# Patient Record
Sex: Female | Born: 1935 | Race: White | Hispanic: No
Health system: Southern US, Community
[De-identification: ages and names within clinical notes are randomized; demographics above are authoritative.]

## PROBLEM LIST (undated history)

## (undated) DIAGNOSIS — N390 Urinary tract infection, site not specified: Secondary | ICD-10-CM

## (undated) DIAGNOSIS — E079 Disorder of thyroid, unspecified: Secondary | ICD-10-CM

## (undated) DIAGNOSIS — I4891 Unspecified atrial fibrillation: Secondary | ICD-10-CM

## (undated) DIAGNOSIS — F0390 Unspecified dementia without behavioral disturbance: Secondary | ICD-10-CM

## (undated) DIAGNOSIS — N289 Disorder of kidney and ureter, unspecified: Secondary | ICD-10-CM

## (undated) DIAGNOSIS — R531 Weakness: Secondary | ICD-10-CM

## (undated) DIAGNOSIS — I2510 Atherosclerotic heart disease of native coronary artery without angina pectoris: Secondary | ICD-10-CM

---

## 2014-02-27 ENCOUNTER — Encounter (HOSPITAL_BASED_OUTPATIENT_CLINIC_OR_DEPARTMENT_OTHER): Payer: Self-pay | Admitting: Emergency Medicine

## 2014-02-27 ENCOUNTER — Emergency Department (HOSPITAL_BASED_OUTPATIENT_CLINIC_OR_DEPARTMENT_OTHER): Payer: Medicare Other

## 2014-02-27 ENCOUNTER — Emergency Department (HOSPITAL_BASED_OUTPATIENT_CLINIC_OR_DEPARTMENT_OTHER)
Admission: EM | Admit: 2014-02-27 | Discharge: 2014-02-27 | Disposition: A | Discharge status: Home / Self Care | Payer: Medicare Other | Attending: Emergency Medicine | Admitting: Emergency Medicine

## 2014-02-27 DIAGNOSIS — Z043 Encounter for examination and observation following other accident: Secondary | ICD-10-CM | POA: Diagnosis not present

## 2014-02-27 DIAGNOSIS — W19XXXA Unspecified fall, initial encounter: Secondary | ICD-10-CM | POA: Insufficient documentation

## 2014-02-27 DIAGNOSIS — Y998 Other external cause status: Secondary | ICD-10-CM | POA: Insufficient documentation

## 2014-02-27 DIAGNOSIS — Y939 Activity, unspecified: Secondary | ICD-10-CM | POA: Diagnosis not present

## 2014-02-27 DIAGNOSIS — Y92122 Bedroom in nursing home as the place of occurrence of the external cause: Secondary | ICD-10-CM | POA: Diagnosis not present

## 2014-02-27 DIAGNOSIS — F0280 Dementia in other diseases classified elsewhere without behavioral disturbance: Secondary | ICD-10-CM | POA: Insufficient documentation

## 2014-02-27 DIAGNOSIS — Y92129 Unspecified place in nursing home as the place of occurrence of the external cause: Secondary | ICD-10-CM

## 2014-02-27 DIAGNOSIS — G309 Alzheimer's disease, unspecified: Secondary | ICD-10-CM | POA: Insufficient documentation

## 2014-02-27 HISTORY — DX: Disorder of thyroid, unspecified: E07.9

## 2014-02-27 HISTORY — DX: Unspecified dementia, unspecified severity, without behavioral disturbance, psychotic disturbance, mood disturbance, and anxiety: F03.90

## 2014-02-27 HISTORY — DX: Unspecified atrial fibrillation: I48.91

## 2014-02-27 HISTORY — DX: Weakness: R53.1

## 2014-02-27 HISTORY — DX: Atherosclerotic heart disease of native coronary artery without angina pectoris: I25.10

## 2014-02-27 HISTORY — DX: Disorder of kidney and ureter, unspecified: N28.9

## 2014-02-27 HISTORY — DX: Urinary tract infection, site not specified: N39.0

## 2014-02-27 NOTE — ED Notes (Signed)
Patient transported to CT 

## 2014-02-27 NOTE — ED Provider Notes (Signed)
CSN: [id number]     Arrival date & time 02/27/14  0149 History   First MD Initiated Contact with Patient 02/27/14 [phone]     Chief Complaint  Patient presents with  . Fall     (Consider location/radiation/quality/duration/timing/severity/associated sxs/prior Treatment) HPI 81 year old female presents to emergency department via EMS from her nursing facility after a fall.  Patient was found on a wooden floor next to her bed.  It was an unwitnessed fall.  No known LOC.  Patient initially complained of some pain to the back of her head, but denies this now.  She has history of Alzheimer's and is a poor historian. No past medical history on file. No past surgical history on file. No family history on file. History  Substance Use Topics  . Smoking status: Not on file  . Smokeless tobacco: Not on file  . Alcohol Use: Not on file   OB History    No data available     Review of Systems  Unable to perform ROS: Dementia      Allergies  Review of patient's allergies indicates not on file.  Home Medications   Prior to Admission medications   Not on File   BP 155/59 mmHg  Pulse 61  Temp(Src) 97.6 F (36.4 C) (Oral)  Resp 20  SpO2 99% Physical Exam  Constitutional: She appears well-developed and well-nourished.  HENT:  Head: Normocephalic and atraumatic.  Right Ear: External ear normal.  Left Ear: External ear normal.  Nose: Nose normal.  Mouth/Throat: Oropharynx is clear and moist.  Eyes: Conjunctivae and EOM are normal. Pupils are equal, round, and reactive to light.  Neck: Normal range of motion. Neck supple. No JVD present. No tracheal deviation present. No thyromegaly present.  Cardiovascular: Normal rate, regular rhythm, normal heart sounds and intact distal pulses.  Exam reveals no gallop and no friction rub.   No murmur heard. Pulmonary/Chest: Effort normal and breath sounds normal. No stridor. No respiratory distress. She has no wheezes. She has no rales. She exhibits  no tenderness.  Abdominal: Soft. Bowel sounds are normal. She exhibits no distension and no mass. There is no tenderness. There is no rebound and no guarding.  Musculoskeletal: Normal range of motion. She exhibits no edema or tenderness.  All 4 extremities examined, range of motion is normal.  No pain to palpation.  No step-off no crepitus, no deformities  Lymphadenopathy:    She has no cervical adenopathy.  Neurological: She is alert. She displays normal reflexes. No cranial nerve deficit. She exhibits normal muscle tone. Coordination normal.  Skin: Skin is warm and dry. No rash noted. No erythema. No pallor.  Psychiatric: She has a normal mood and affect. Her behavior is normal. Judgment and thought content normal.  Nursing note and vitals reviewed.   ED Course  Procedures (including critical care time) Labs Review Labs Reviewed - No data to display  Imaging Review No results found.   EKG Interpretation None     No results found for this or any previous visit. Ct Head Wo Contrast  02/27/2014   CLINICAL DATA:  Patient was found on the floor of the side of the head. Head pain. No loss of consciousness. History of a dementia. Initial encounter  EXAM: CT HEAD WITHOUT CONTRAST  CT CERVICAL SPINE WITHOUT CONTRAST  TECHNIQUE: Multidetector CT imaging of the head and cervical spine was performed following the standard protocol without intravenous contrast. Multiplanar CT image reconstructions of the cervical spine were also generated.  COMPARISON:  10/15/2013  FINDINGS: CT HEAD FINDINGS  Diffuse cerebral atrophy. Low-attenuation changes in the deep white matter consistent with small vessel ischemia. Ventricular dilatation consistent with central atrophy. Focal area of encephalomalacia in the right posterior parietal region consistent with old infarct, unchanged since prior study. No mass effect or midline shift. No abnormal extra-axial fluid collections. Gray-white matter junctions are distinct.  Basal cisterns are not effaced. No evidence of acute intracranial hemorrhage. No depressed skull fractures. Mild mucosal thickening in the paranasal sinuses. Mastoid air cells are not opacified. Vascular calcifications.  CT CERVICAL SPINE FINDINGS  Diffuse bone demineralization. Mild anterior subluxation of C4 on C5, unchanged since prior study. Normal alignment of facet joints. Degenerative changes throughout the cervical spine and facet joints. No vertebral compression deformities. No prevertebral soft tissue swelling. C1-2 articulation appears intact. No focal bone destruction or bone lesion identified. Bone cortex and trabecular architecture appear intact. Soft tissues are unremarkable. Vascular calcifications in the cervical carotid arteries. Surgical absence of the right thyroid with nodular enlargement of the left thyroid.  IMPRESSION: No acute intracranial abnormalities. Unchanged mild anterior subluxation of C4 on C5. Diffuse degenerative change throughout the cervical spine. No acute displaced fractures identified. No change since prior studies.   Electronically Signed   By: Burman NievesWilliam  Stevens M.D.   On: 02/27/2014 02:28   Ct Cervical Spine Wo Contrast  02/27/2014   CLINICAL DATA:  Patient was found on the floor of the side of the head. Head pain. No loss of consciousness. History of a dementia. Initial encounter  EXAM: CT HEAD WITHOUT CONTRAST  CT CERVICAL SPINE WITHOUT CONTRAST  TECHNIQUE: Multidetector CT imaging of the head and cervical spine was performed following the standard protocol without intravenous contrast. Multiplanar CT image reconstructions of the cervical spine were also generated.  COMPARISON:  10/15/2013  FINDINGS: CT HEAD FINDINGS  Diffuse cerebral atrophy. Low-attenuation changes in the deep white matter consistent with small vessel ischemia. Ventricular dilatation consistent with central atrophy. Focal area of encephalomalacia in the right posterior parietal region consistent with  old infarct, unchanged since prior study. No mass effect or midline shift. No abnormal extra-axial fluid collections. Gray-white matter junctions are distinct. Basal cisterns are not effaced. No evidence of acute intracranial hemorrhage. No depressed skull fractures. Mild mucosal thickening in the paranasal sinuses. Mastoid air cells are not opacified. Vascular calcifications.  CT CERVICAL SPINE FINDINGS  Diffuse bone demineralization. Mild anterior subluxation of C4 on C5, unchanged since prior study. Normal alignment of facet joints. Degenerative changes throughout the cervical spine and facet joints. No vertebral compression deformities. No prevertebral soft tissue swelling. C1-2 articulation appears intact. No focal bone destruction or bone lesion identified. Bone cortex and trabecular architecture appear intact. Soft tissues are unremarkable. Vascular calcifications in the cervical carotid arteries. Surgical absence of the right thyroid with nodular enlargement of the left thyroid.  IMPRESSION: No acute intracranial abnormalities. Unchanged mild anterior subluxation of C4 on C5. Diffuse degenerative change throughout the cervical spine. No acute displaced fractures identified. No change since prior studies.   Electronically Signed   By: Burman NievesWilliam  Stevens M.D.   On: 02/27/2014 02:28     MDM   Final diagnoses:  Fall at nursing home, initial encounter    81 year old female found on the floor of her nursing facility unwitnessed fall.  Plan for head and C-spine CT scan.  If negative, feel the patient is stable for discharge back to her facility.    Olivia Mackielga M Jedaiah Rathbun, MD 02/27/14 [phone]

## 2014-02-27 NOTE — ED Notes (Signed)
Poss fell at nursing home  Was found on wooden floor  No loss of consciousness  Pt denies falling   Complained of some pain back of head earlier none now

## 2014-02-27 NOTE — Discharge Instructions (Signed)
Your evaluation in the ER today after your fall did not show any broken bones or serious injury.  Please be careful in the future when you are getting out of bed or a chair as you may fall again.  Follow up with your doctor for recheck in 2-3 days.  Return to the ER for worsening condition or new concerning symptoms.    FALL PREVENTION (EDU)  FALL PREVENTION (EDU): You have requested information on Fall Prevention.  According to a 2003 study from the Journal of the [employer], more than 1.8 million adults, aged 79 and older, were treated in emergency departments for fall-related injuries. More than 421,000 were hospitalized. The most common injuries from a fall are head injuries that in turn cause a brain injury, and fractures (broken bones). Of all types of broken bones that happen from falls, hip fractures are the most serious and lead to the greatest number of health problems and deaths.  To make your living area safer, older adults should consider the following:      Improve lighting throughout the home. Use night-lights to help you see at night.     Have handrails installed on both sides of stairways.     Have grab bars placed next to the toilet and in the shower. Also consider an elevated toilet seat and a shower chair.     Use non-slip bath mats in the tub or shower.     Remove "throw rugs" to prevent tripping.     Avoid the use of long robes to prevent tripping.     Wear well-fitted shoes or slippers. Wearing loose footwear can cause you to shuffle, and make you more likely to trip and fall. Inexpensive anti-slip socks can also be purchased.     Be sure to keep all electrical cords and small objects out of the pathway.     If a cane, walker or any other assistive device is used, be sure to have them inspected regularly. The devices must be used correctly to prevent injuries.     Remember to move about at a pace that is comfortable for your ability. For  example, do not rush to answer the doorbell or the phone. Take your time. In recent studies, a number of risk factors have been identified that make older adults more likely to have falls. It has also been shown that when these risk factors are modified, it will help to prevent falls.      Exercise: Regular physical activity or exercise increases body strength and improves balance.     Medication Review: Follow up with your doctor and pharmacist as needed to review your medications and any recent changes that may have been made. They can tell you if there are drug interactions and side-effects. If you are taking any sedatives or sleeping pills, it might be possible to have the dosage decreased, or have the number of medications reduced. These kinds of medications can cause drowsiness and dizziness, thus posing a risk of falling.     Vision Checks: Follow up with an eye doctor at least once a year to have your vision checked.  If you develop symptoms of Shortness of Breath, Chest Pain, Swelling of lips, mouth or tongue or if your condition becomes worse with any new symptoms, see your doctor or return to the Emergency Department for immediate care. Emergency services are not intended to be a substitute for comprehensive medical attention.  Please contact your doctor for follow up  if not improving as expected.   Call your doctor in 5-7 days or as directed if there is no improvement.   Community Resources: *IF YOU ARE IN IMMEDIATE DANGER CALL 911!  Abuse/Neglect:  Family Services Crisis Hotline [facility]([place]): [phone](336) [phone] Center Against Violence [facility]([place]): 4122598713(336) [phone]  After hours, holidays and weekends: [phone] National Domestic Violence Hotline: (434)348-2491713-299-2070  Mental Health: [facility]: [address]: [phone]  Health Clinics:  [facility]): [phone] Monday - Friday 8 AM - 9 PM, Saturday and Sunday 10 AM - 9  PM  [facility] [place]: [phone] Monday - Friday 8 AM - 5 PM  [facility]  [address]: [phone] Monday- Friday 8:30 AM - 5:30 PM, Sat 9 AM - 1 PM  24 [facility] [place] [facility] [address]: [phone] [facility] [address]: [phone] [facility] on [address]: [phone]  24 [facility]: [address] [phone]  Cultures: If culture results are positive, we will notify you if a change in treatment is necessary.  LABORATORY TESTS:         If you had any labs drawn in the ED that have not resulted by the time you are discharged home, we will review these lab results and the treatment given to you.  If there is any further treatment or notification needed, we will contact you by phone, or letter.  "PLEASE ENSURE THAT YOU HAVE GIVEN US YOUR CURRENT WORKING PHONE NUMBER AND YOUR CURRENT ADDRESS, so that we can contact you if needed."  RADIOLOGY TESTS:  If the referred physician wants todays x-rays, please call the hospitals Radiology Department the day before your doctors appointment. [place]     [phone] [place]   [phone] [place]     [phone]  Our doctors and staff appreciate your choosing us for your emergency medical care needs. We are here to serve you

## 2014-02-27 NOTE — ED Notes (Signed)
Pt returned home by ptar

## 2014-02-27 NOTE — ED Notes (Signed)
Per ems pt found on floor beside bed,  Ems stated she c/o some head pain on their arrival,  At present pt denies pain, no abrasion or hematoma noted denies loc Pt alert

## 2014-03-21 ENCOUNTER — Emergency Department (HOSPITAL_BASED_OUTPATIENT_CLINIC_OR_DEPARTMENT_OTHER)
Admission: EM | Admit: 2014-03-21 | Discharge: 2014-03-21 | Disposition: A | Discharge status: Home / Self Care | Payer: Medicare Other | Attending: Emergency Medicine | Admitting: Emergency Medicine

## 2014-03-21 ENCOUNTER — Emergency Department (HOSPITAL_BASED_OUTPATIENT_CLINIC_OR_DEPARTMENT_OTHER): Payer: Medicare Other

## 2014-03-21 ENCOUNTER — Encounter (HOSPITAL_BASED_OUTPATIENT_CLINIC_OR_DEPARTMENT_OTHER): Payer: Self-pay

## 2014-03-21 DIAGNOSIS — I2510 Atherosclerotic heart disease of native coronary artery without angina pectoris: Secondary | ICD-10-CM | POA: Insufficient documentation

## 2014-03-21 DIAGNOSIS — Y9289 Other specified places as the place of occurrence of the external cause: Secondary | ICD-10-CM | POA: Insufficient documentation

## 2014-03-21 DIAGNOSIS — Z87448 Personal history of other diseases of urinary system: Secondary | ICD-10-CM | POA: Diagnosis not present

## 2014-03-21 DIAGNOSIS — Z87440 Personal history of urinary (tract) infections: Secondary | ICD-10-CM | POA: Insufficient documentation

## 2014-03-21 DIAGNOSIS — Y998 Other external cause status: Secondary | ICD-10-CM | POA: Insufficient documentation

## 2014-03-21 DIAGNOSIS — W19XXXA Unspecified fall, initial encounter: Secondary | ICD-10-CM | POA: Diagnosis not present

## 2014-03-21 DIAGNOSIS — Z8639 Personal history of other endocrine, nutritional and metabolic disease: Secondary | ICD-10-CM | POA: Diagnosis not present

## 2014-03-21 DIAGNOSIS — Y9389 Activity, other specified: Secondary | ICD-10-CM | POA: Diagnosis not present

## 2014-03-21 DIAGNOSIS — Z79899 Other long term (current) drug therapy: Secondary | ICD-10-CM | POA: Insufficient documentation

## 2014-03-21 DIAGNOSIS — S0990XA Unspecified injury of head, initial encounter: Secondary | ICD-10-CM | POA: Insufficient documentation

## 2014-03-21 DIAGNOSIS — F0390 Unspecified dementia without behavioral disturbance: Secondary | ICD-10-CM | POA: Diagnosis not present

## 2014-03-21 LAB — URINALYSIS, ROUTINE W REFLEX MICROSCOPIC
BILIRUBIN URINE: NEGATIVE
Glucose, UA: NEGATIVE mg/dL
Hgb urine dipstick: NEGATIVE
KETONES UR: NEGATIVE mg/dL
Leukocytes, UA: NEGATIVE
NITRITE: NEGATIVE
PH: 6 (ref 5.0–8.0)
PROTEIN: 30 mg/dL — AB
SPECIFIC GRAVITY, URINE: 1.015 (ref 1.005–1.030)
Urobilinogen, UA: 0.2 mg/dL (ref 0.0–1.0)

## 2014-03-21 LAB — URINE MICROSCOPIC-ADD ON

## 2014-03-21 MED ORDER — ACETAMINOPHEN 325 MG PO TABS
650.0000 mg | ORAL_TABLET | Freq: Once | ORAL | Status: AC
  Administered 2014-03-21: 650 mg via ORAL
  Filled 2014-03-21: qty 2

## 2014-03-21 NOTE — ED Notes (Signed)
Called [facility] for transport to [facility]

## 2014-03-21 NOTE — ED Provider Notes (Signed)
TIME SEEN: 11:20 AM  CHIEF COMPLAINT: Fall  HPI: Pt is a 81 y.o. female with history of dementia, coronary artery disease, atrial fibrillation, hypothyroidism who had unwitnessed fall at her nursing facility. She is complaining of pain to the back of her head. It is unclear if she lost consciousness. She has been here in the past for similar symptoms. Does not appear to be on anticoagulation.  ROS: Level V caveat for dementia  PAST MEDICAL HISTORY/PAST SURGICAL HISTORY:  Past Medical History  Diagnosis Date  . Renal disorder   . UTI (urinary tract infection)   . Dementia   . Coronary artery disease   . Weakness   . Atrial fibrillation   . Thyroid disease     MEDICATIONS:  Prior to Admission medications   Medication Sig Start Date End Date Taking? Authorizing Provider  atenolol (TENORMIN) 50 MG tablet Take 50 mg by mouth daily.    Historical Provider, MD  cyanocobalamin 500 MCG tablet Take 500 mcg by mouth daily.    Historical Provider, MD  diltiazem (DILACOR XR) 240 MG 24 hr capsule Take 240 mg by mouth daily.    Historical Provider, MD  divalproex (DEPAKOTE SPRINKLE) 125 MG capsule Take 125 mg by mouth 3 (three) times daily.    Historical Provider, MD  LORazepam (ATIVAN) 0.5 MG tablet Take 0.5 mg by mouth 2 (two) times daily. prn    Historical Provider, MD  memantine (NAMENDA) 10 MG tablet Take 14 mg by mouth 2 (two) times daily.    Historical Provider, MD  ondansetron (ZOFRAN) 4 MG tablet Take 4 mg by mouth every 8 (eight) hours as needed for nausea or vomiting.    Historical Provider, MD  polyvinyl alcohol (LIQUIFILM TEARS) 1.4 % ophthalmic solution Place 2 drops into both eyes as needed for dry eyes.    Historical Provider, MD  Vitamin D, Ergocalciferol, (DRISDOL) 50000 UNITS CAPS capsule Take 50,000 Units by mouth every 7 (seven) days.    Historical Provider, MD    ALLERGIES:  No Known Allergies  SOCIAL HISTORY:  History  Substance Use Topics  . Smoking status: Unknown If  Ever Smoked  . Smokeless tobacco: Not on file  . Alcohol Use: No    FAMILY HISTORY: No family history on file.  EXAM: BP 169/59 mmHg  Pulse 57  Temp(Src) 97.7 F (36.5 C) (Oral)  Resp 18  SpO2 98% CONSTITUTIONAL: Alert and oriented and responds appropriately to questions. Well-appearing; well-nourished; GCS 15 HEAD: Normocephalic; atraumatic EYES: Conjunctivae clear, PERRL, EOMI ENT: normal nose; no rhinorrhea; moist mucous membranes; pharynx without lesions noted; no dental injury; ; no septal hematoma NECK: Supple, no meningismus, no LAD; no midline spinal tenderness, step-off or deformity CARD: RRR; S1 and S2 appreciated; no murmurs, no clicks, no rubs, no gallops RESP: Normal chest excursion without splinting or tachypnea; breath sounds clear and equal bilaterally; no wheezes, no rhonchi, no rales; chest wall stable, nontender to palpation ABD/GI: Normal bowel sounds; non-distended; soft, non-tender, no rebound, no guarding PELVIS:  stable, nontender to palpation BACK:  The back appears normal and is non-tender to palpation, there is no CVA tenderness; no midline spinal tenderness, step-off or deformity EXT: Normal ROM in all joints; non-tender to palpation; no edema; normal capillary refill; no cyanosis    SKIN: Normal color for age and race; warm NEURO: Moves all extremities equally, reports normal sensation diffusely, cranial nerves II through XII intact PSYCH: The patient's mood and manner are appropriate. Grooming and personal hygiene are  appropriate.  MEDICAL DECISION MAKING: Patient here with unwitnessed fall. No obvious sign of injury on exam except for complaints of posterior headache. We'll give Tylenol. We'll obtain CT imaging of her head and cervical spine.  ED PROGRESS: Imaging is unremarkable for acute injury. She still neurologically baseline. We'll discharge back to nursing facility.     Layla MawKristen N Smt. L[place], DO 03/21/14 [phone]

## 2014-03-21 NOTE — ED Notes (Signed)
Tripped on a blanket and fell on the ground striking her head on the floor. No LOC. Pt has no complaints.

## 2014-03-21 NOTE — Discharge Instructions (Signed)
Head Injury  You have received a head injury. It does not appear serious at this time. Headaches and vomiting are common following head injury. It should be easy to awaken from sleeping. Sometimes it is necessary for you to stay in the emergency department for a while for observation. Sometimes admission to the hospital may be needed. After injuries such as yours, most problems occur within the first 24 hours, but side effects may occur up to 7-10 days after the injury. It is important for you to carefully monitor your condition and contact your health care provider or seek immediate medical care if there is a change in your condition.  WHAT ARE THE TYPES OF HEAD INJURIES?  Head injuries can be as minor as a bump. Some head injuries can be more severe. More severe head injuries include:   A jarring injury to the brain (concussion).   A bruise of the brain (contusion). This mean there is bleeding in the brain that can cause swelling.   A cracked skull (skull fracture).   Bleeding in the brain that collects, clots, and forms a bump (hematoma).  WHAT CAUSES A HEAD INJURY?  A serious head injury is most likely to happen to someone who is in a car wreck and is not wearing a seat belt. Other causes of major head injuries include bicycle or motorcycle accidents, sports injuries, and falls.  HOW ARE HEAD INJURIES DIAGNOSED?  A complete history of the event leading to the injury and your current symptoms will be helpful in diagnosing head injuries. Many times, pictures of the brain, such as CT or MRI are needed to see the extent of the injury. Often, an overnight hospital stay is necessary for observation.   WHEN SHOULD I SEEK IMMEDIATE MEDICAL CARE?   You should get help right away if:   You have confusion or drowsiness.   You feel sick to your stomach (nauseous) or have continued, forceful vomiting.   You have dizziness or unsteadiness that is getting worse.   You have severe, continued headaches not relieved by  medicine. Only take over-the-counter or prescription medicines for pain, fever, or discomfort as directed by your health care provider.   You do not have normal function of the arms or legs or are unable to walk.   You notice changes in the black spots in the center of the colored part of your eye (pupil).   You have a clear or bloody fluid coming from your nose or ears.   You have a loss of vision.  During the next 24 hours after the injury, you must stay with someone who can watch you for the warning signs. This person should contact local emergency services (911 in the [place] if you have seizures, you become unconscious, or you are unable to wake up.  HOW CAN I PREVENT A HEAD INJURY IN THE FUTURE?  The most important factor for preventing major head injuries is avoiding motor vehicle accidents. To minimize the potential for damage to your head, it is crucial to wear seat belts while riding in motor vehicles. Wearing helmets while bike riding and playing collision sports (like football) is also helpful. Also, avoiding dangerous activities around the house will further help reduce your risk of head injury.   WHEN CAN I RETURN TO NORMAL ACTIVITIES AND ATHLETICS?  You should be reevaluated by your health care provider before returning to these activities. If you have any of the following symptoms, you should not return to activities   or contact sports until 1 week after the symptoms have stopped:   Persistent headache.   Dizziness or vertigo.   Poor attention and concentration.   Confusion.   Memory problems.   Nausea or vomiting.   Fatigue or tire easily.   Irritability.   Intolerant of bright lights or loud noises.   Anxiety or depression.   Disturbed sleep.  MAKE SURE YOU:    Understand these instructions.   Will watch your condition.   Will get help right away if you are not doing well or get worse.  Document Released: 02/08/2005 Document Revised: 02/13/2013 Document Reviewed:  10/16/2012  [employer] Patient Information 2015 [employer]. This information is not intended to replace advice given to you by your health care provider. Make sure you discuss any questions you have with your health care provider.    Fall Prevention and Home Safety  Falls cause injuries and can affect all age groups. It is possible to use preventive measures to significantly decrease the likelihood of falls. There are many simple measures which can make your home safer and prevent falls.  OUTDOORS   Repair cracks and edges of walkways and driveways.   Remove high doorway thresholds.   Trim shrubbery on the main path into your home.   Have good outside lighting.   Clear walkways of tools, rocks, debris, and clutter.   Check that handrails are not broken and are securely fastened. Both sides of steps should have handrails.   Have leaves, snow, and ice cleared regularly.   Use sand or salt on walkways during winter months.   In the garage, clean up grease or oil spills.  BATHROOM   Install night lights.   Install grab bars by the toilet and in the tub and shower.   Use non-skid mats or decals in the tub or shower.   Place a plastic non-slip stool in the shower to sit on, if needed.   Keep floors dry and clean up all water on the floor immediately.   Remove soap buildup in the tub or shower on a regular basis.   Secure bath mats with non-slip, double-sided rug tape.   Remove throw rugs and tripping hazards from the floors.  BEDROOMS   Install night lights.   Make sure a bedside light is easy to reach.   Do not use oversized bedding.   Keep a telephone by your bedside.   Have a firm chair with side arms to use for getting dressed.   Remove throw rugs and tripping hazards from the floor.  KITCHEN   Keep handles on pots and pans turned toward the center of the stove. Use back burners when possible.   Clean up spills quickly and allow time for drying.   Avoid walking on wet floors.   Avoid hot  utensils and knives.   Position shelves so they are not too high or low.   Place commonly used objects within easy reach.   If necessary, use a sturdy step stool with a grab bar when reaching.   Keep electrical cables out of the way.   Do not use floor polish or wax that makes floors slippery. If you must use wax, use non-skid floor wax.   Remove throw rugs and tripping hazards from the floor.  STAIRWAYS   Never leave objects on stairs.   Place handrails on both sides of stairways and use them. Fix any loose handrails. Make sure handrails on both sides of the stairways   are as long as the stairs.   Check carpeting to make sure it is firmly attached along stairs. Make repairs to worn or loose carpet promptly.   Avoid placing throw rugs at the top or bottom of stairways, or properly secure the rug with carpet tape to prevent slippage. Get rid of throw rugs, if possible.   Have an electrician put in a light switch at the top and bottom of the stairs.  OTHER FALL PREVENTION TIPS   Wear low-heel or rubber-soled shoes that are supportive and fit well. Wear closed toe shoes.   When using a stepladder, make sure it is fully opened and both spreaders are firmly locked. Do not climb a closed stepladder.   Add color or contrast paint or tape to grab bars and handrails in your home. Place contrasting color strips on first and last steps.   Learn and use mobility aids as needed. Install an electrical emergency response system.   Turn on lights to avoid dark areas. Replace light bulbs that burn out immediately. Get light switches that glow.   Arrange furniture to create clear pathways. Keep furniture in the same place.   Firmly attach carpet with non-skid or double-sided tape.   Eliminate uneven floor surfaces.   Select a carpet pattern that does not visually hide the edge of steps.   Be aware of all pets.  OTHER HOME SAFETY TIPS   Set the water temperature for 120 F (48.8 C).   Keep emergency numbers on  or near the telephone.   Keep smoke detectors on every level of the home and near sleeping areas.  Document Released: 01/29/2002 Document Revised: 08/10/2011 Document Reviewed: 04/30/2011  [employer] Patient Information 2015 [employer]. This information is not intended to replace advice given to you by your health care provider. Make sure you discuss any questions you have with your health care provider.

## 2014-04-23 DEATH — deceased

## 2014-05-24 DEATH — deceased

## 2016-07-31 IMAGING — CT CT CERVICAL SPINE W/O CM
3 of 5 series · 13 of 35 positions shown, 16 images · non-contrast
Comparison: 10/15/2013

CLINICAL DATA: Patient was found on the floor of the side of the
head. Head pain. No loss of consciousness. History of a dementia.
Initial encounter

EXAM:
CT HEAD WITHOUT CONTRAST
CT CERVICAL SPINE WITHOUT CONTRAST
TECHNIQUE: Multidetector CT imaging of the head and cervical spine was
performed following the standard protocol without intravenous
contrast. Multiplanar CT image reconstructions of the cervical spine
were also generated.

[Series 8: c_spine 2.0 coronal · coronal · 0.25mm/px · 3 of 56 slices shown]
[im 13/56  bone]
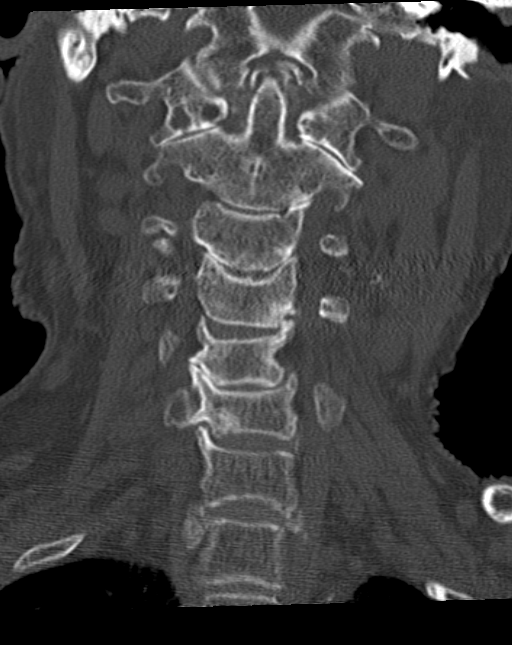
[im 23/56  bone]
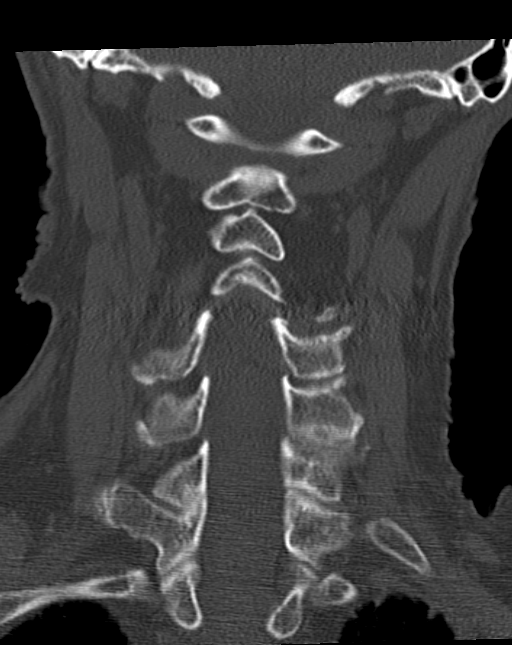
[im 33/56  bone]
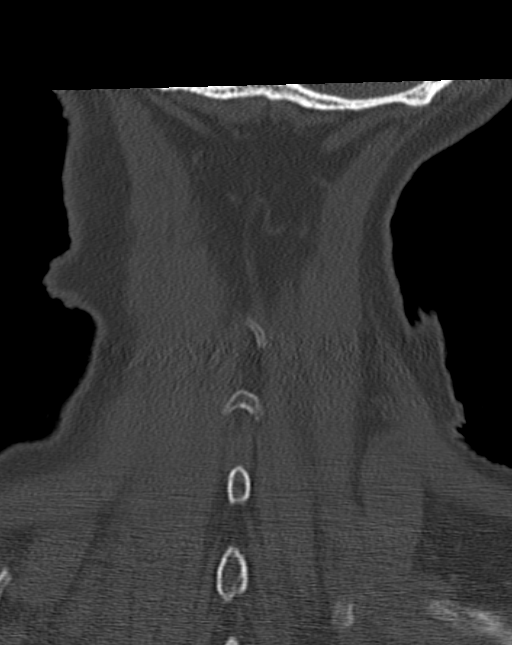

[Series 9: c_spine 2.0 sagittal · sagittal · 0.28mm/px · 5 of 60 slices shown, 6 images]
[im 20/60  bone]
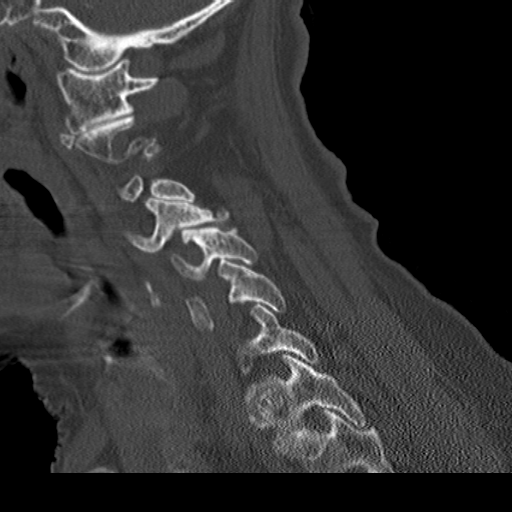
[im 25/60  bone]
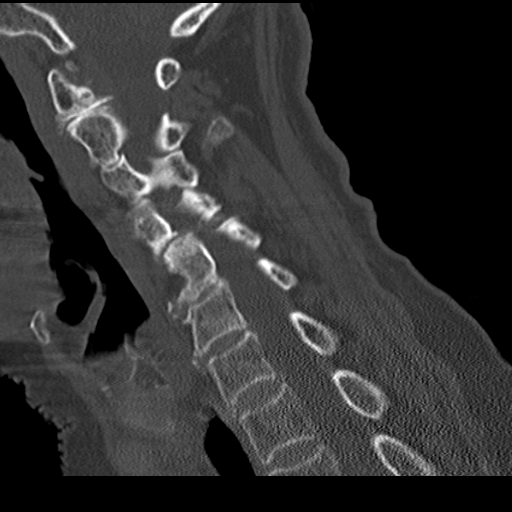
[im 30/60  soft-tissue]
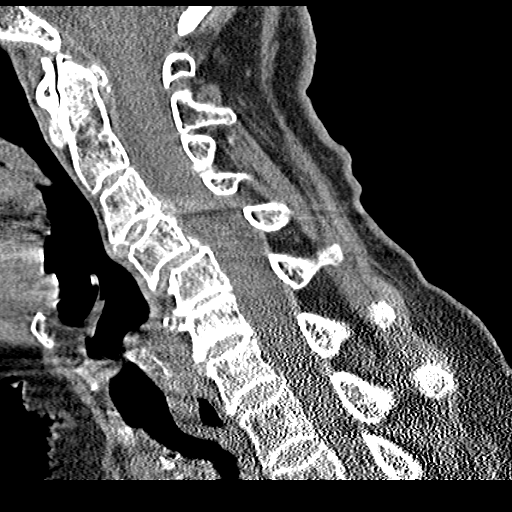
[im 30/60  bone]
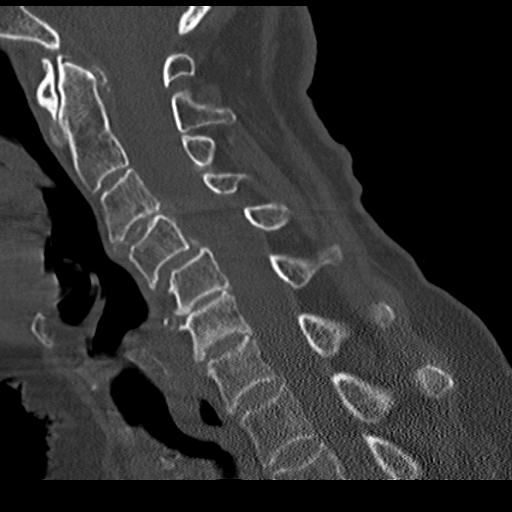
[im 35/60  bone]
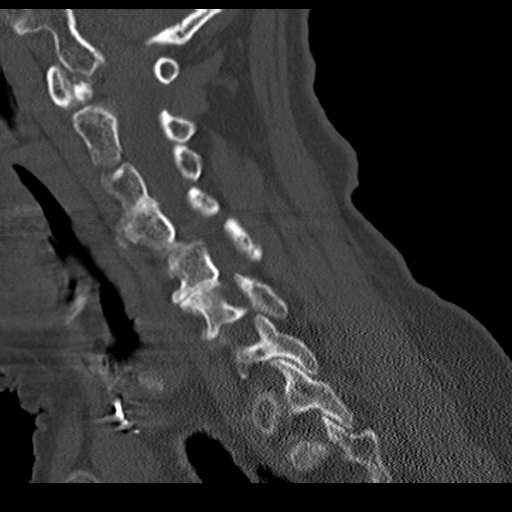
[im 40/60  bone]
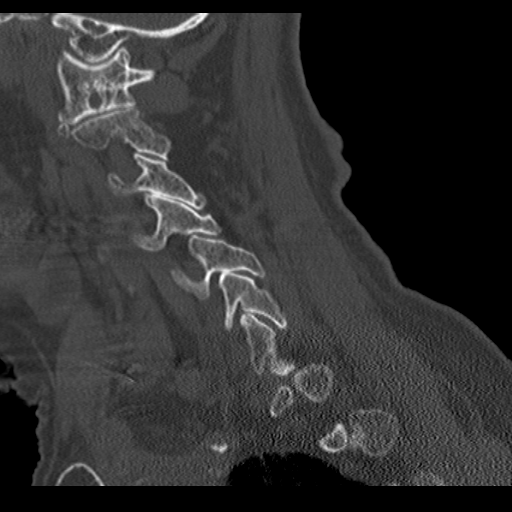

[Series 10: c_spine 2.0 orth ax · axial · 0.21mm/px · z∈[-343,-239]mm · 5 of 89 slices shown, 7 images]
[im 15/89  soft-tissue]
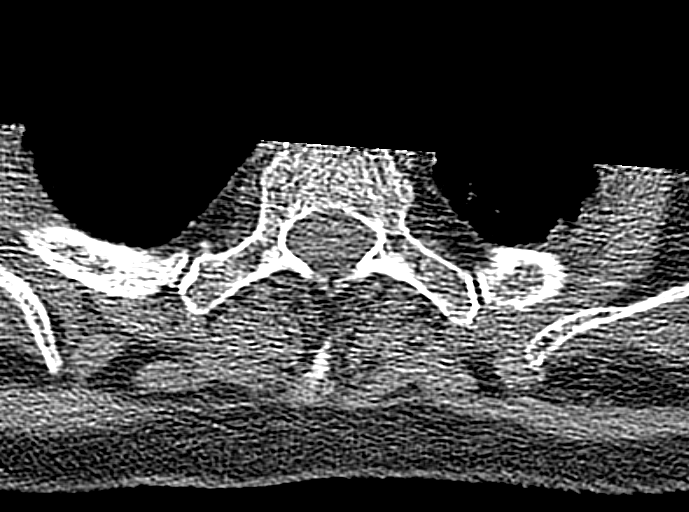
[im 15/89  bone]
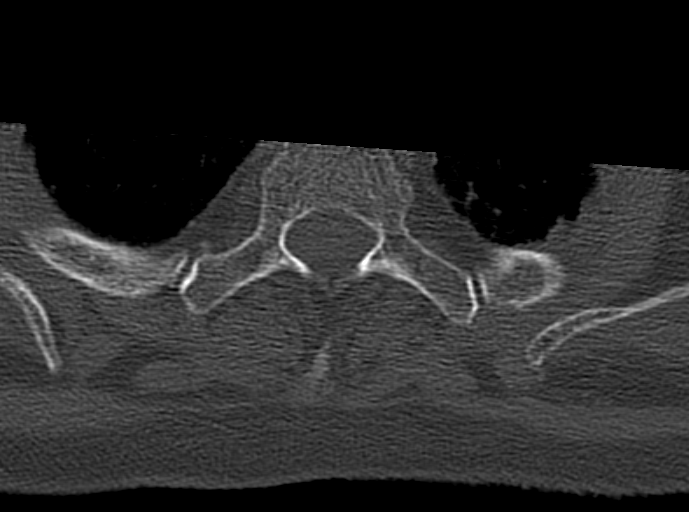
[im 30/89  bone]
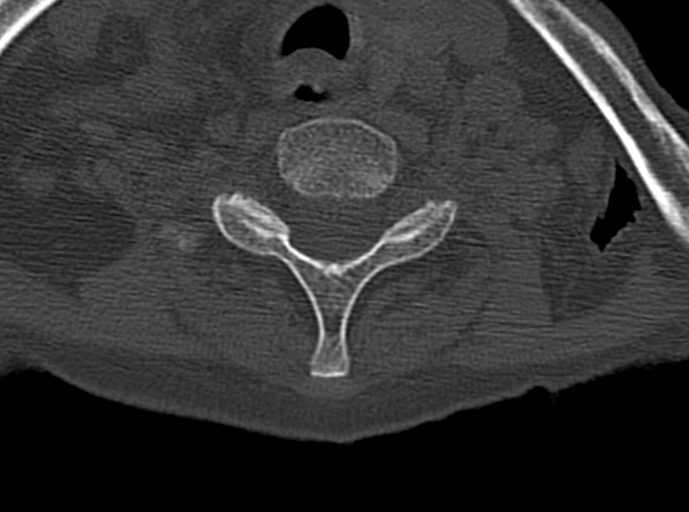
[im 45/89  bone]
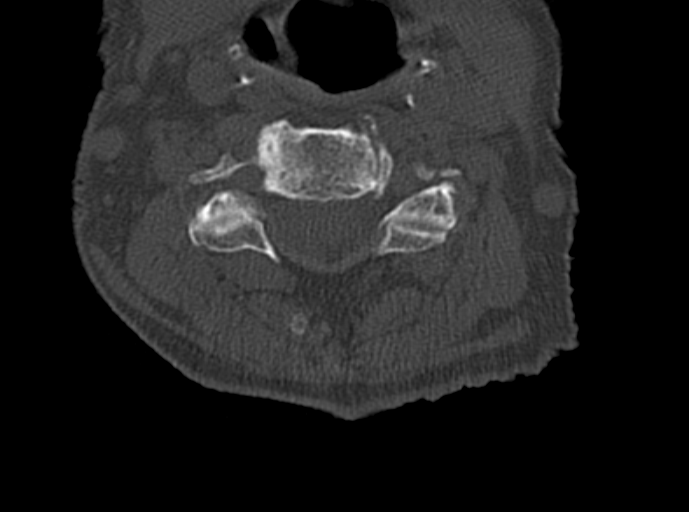
[im 59/89  bone]
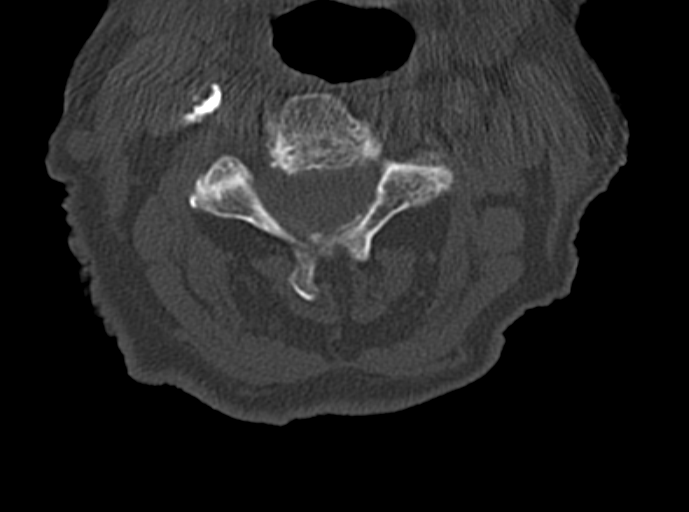
[im 74/89  soft-tissue]
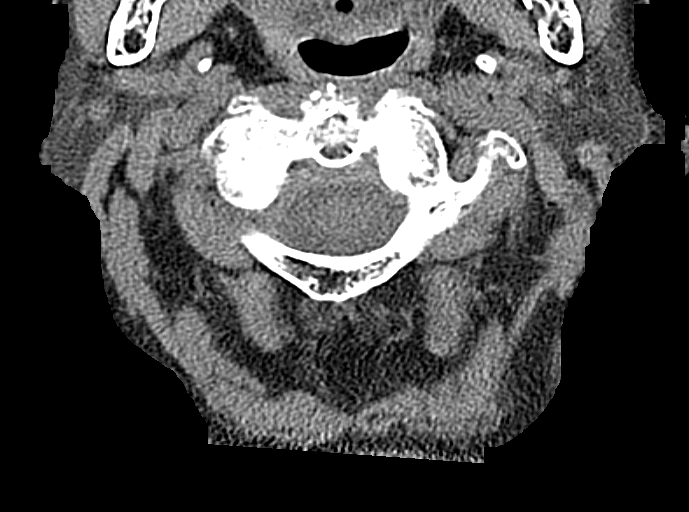
[im 74/89  bone]
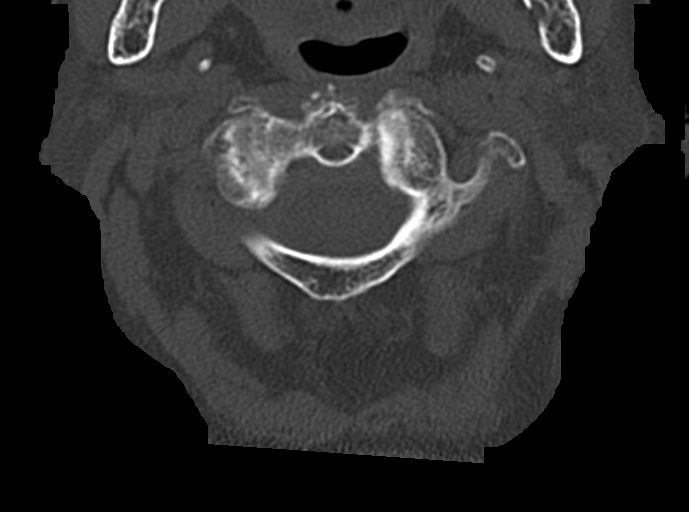

[13 of 35 positions shown; findings below may reference images not displayed]

FINDINGS: CT HEAD FINDINGS

Diffuse cerebral atrophy. Low-attenuation changes in the deep white
matter consistent with small vessel ischemia. Ventricular dilatation
consistent with central atrophy. Focal area of encephalomalacia in
the right posterior parietal region consistent with old infarct,
unchanged since prior study. No mass effect or midline shift. No
abnormal extra-axial fluid collections. Gray-white matter junctions
are distinct. Basal cisterns are not effaced. No evidence of acute
intracranial hemorrhage. No depressed skull fractures. Mild mucosal
thickening in the paranasal sinuses. Mastoid air cells are not
opacified. Vascular calcifications.

CT CERVICAL SPINE FINDINGS

Diffuse bone demineralization. Mild anterior subluxation of C4 on
C5, unchanged since prior study. Normal alignment of facet joints.
Degenerative changes throughout the cervical spine and facet joints.
No vertebral compression deformities. No prevertebral soft tissue
swelling. C1-2 articulation appears intact. No focal bone
destruction or bone lesion identified. Bone cortex and trabecular
architecture appear intact. Soft tissues are unremarkable. Vascular
calcifications in the cervical carotid arteries. Surgical absence of
the right thyroid with nodular enlargement of the left thyroid.
IMPRESSION: No acute intracranial abnormalities. Unchanged mild anterior
subluxation of C4 on C5. Diffuse degenerative change throughout the
cervical spine. No acute displaced fractures identified. No change
since prior studies.
